# Patient Record
Sex: Female | Born: 1970 | Race: Black or African American | Hispanic: No | Marital: Single | Smoking: Never smoker
Health system: Southern US, Community
[De-identification: ages and names within clinical notes are randomized; demographics above are authoritative.]

## PROBLEM LIST (undated history)

## (undated) DIAGNOSIS — J45909 Unspecified asthma, uncomplicated: Secondary | ICD-10-CM

---

## 1998-02-05 ENCOUNTER — Other Ambulatory Visit: Admission: RE | Admit: 1998-02-05 | Discharge: 1998-02-05 | Payer: Self-pay | Admitting: Obstetrics and Gynecology

## 1998-08-15 ENCOUNTER — Emergency Department (HOSPITAL_COMMUNITY): Admission: EM | Admit: 1998-08-15 | Discharge: 1998-08-15 | Payer: Self-pay | Admitting: Emergency Medicine

## 1999-02-04 ENCOUNTER — Encounter: Payer: Self-pay | Admitting: Emergency Medicine

## 1999-02-04 ENCOUNTER — Emergency Department (HOSPITAL_COMMUNITY): Admission: EM | Admit: 1999-02-04 | Discharge: 1999-02-04 | Payer: Self-pay | Admitting: Emergency Medicine

## 1999-03-31 ENCOUNTER — Emergency Department (HOSPITAL_COMMUNITY): Admission: EM | Admit: 1999-03-31 | Discharge: 1999-03-31 | Payer: Self-pay | Admitting: Emergency Medicine

## 1999-04-22 ENCOUNTER — Other Ambulatory Visit: Admission: RE | Admit: 1999-04-22 | Discharge: 1999-04-22 | Payer: Self-pay | Admitting: Obstetrics and Gynecology

## 2000-01-01 ENCOUNTER — Encounter: Payer: Self-pay | Admitting: Otolaryngology

## 2000-01-01 ENCOUNTER — Encounter: Admission: RE | Admit: 2000-01-01 | Discharge: 2000-01-01 | Payer: Self-pay | Admitting: Otolaryngology

## 2002-10-30 ENCOUNTER — Emergency Department (HOSPITAL_COMMUNITY): Admission: EM | Admit: 2002-10-30 | Discharge: 2002-10-30 | Payer: Self-pay | Admitting: Emergency Medicine

## 2003-04-01 ENCOUNTER — Emergency Department (HOSPITAL_COMMUNITY): Admission: EM | Admit: 2003-04-01 | Discharge: 2003-04-01 | Payer: Self-pay | Admitting: Emergency Medicine

## 2005-10-26 ENCOUNTER — Emergency Department (HOSPITAL_COMMUNITY): Admission: EM | Admit: 2005-10-26 | Discharge: 2005-10-27 | Payer: Self-pay | Admitting: Emergency Medicine

## 2019-03-16 ENCOUNTER — Emergency Department: Payer: Self-pay

## 2019-03-16 ENCOUNTER — Emergency Department
Admission: EM | Admit: 2019-03-16 | Discharge: 2019-03-16 | Disposition: A | Discharge status: Home / Self Care | Payer: Self-pay | Attending: Emergency Medicine | Admitting: Emergency Medicine

## 2019-03-16 ENCOUNTER — Encounter: Payer: Self-pay | Admitting: *Deleted

## 2019-03-16 ENCOUNTER — Other Ambulatory Visit: Payer: Self-pay

## 2019-03-16 DIAGNOSIS — M79662 Pain in left lower leg: Secondary | ICD-10-CM | POA: Insufficient documentation

## 2019-03-16 DIAGNOSIS — W19XXXA Unspecified fall, initial encounter: Secondary | ICD-10-CM | POA: Insufficient documentation

## 2019-03-16 DIAGNOSIS — J45909 Unspecified asthma, uncomplicated: Secondary | ICD-10-CM | POA: Insufficient documentation

## 2019-03-16 DIAGNOSIS — M79605 Pain in left leg: Secondary | ICD-10-CM

## 2019-03-16 HISTORY — DX: Unspecified asthma, uncomplicated: J45.909

## 2019-03-16 MED ORDER — NAPROXEN 500 MG PO TABS
500.0000 mg | ORAL_TABLET | Freq: Once | ORAL | Status: AC
  Administered 2019-03-16: 21:00:00 500 mg via ORAL
  Filled 2019-03-16: qty 1

## 2019-03-16 MED ORDER — NAPROXEN 500 MG PO TABS: 500.0000 mg | ORAL_TABLET | Freq: Two times a day (BID) | ORAL | 0 refills | Status: AC

## 2019-03-16 NOTE — Discharge Instructions (Signed)
Please follow up with your primary care provider for symptoms that are not improving over the next few days.  Rest, ice, and elevate your leg often for the next few days.

## 2019-03-16 NOTE — ED Triage Notes (Signed)
Pt presents via EMS after injury sustained at [facility]. Pt reports a mirror falling on L leg. Pt has not ambulated since injury. Pt has minor abrasions/lacerations. Pedal pulses 2+ and strong. Bleeding controlled at this time.

## 2019-03-16 NOTE — ED Provider Notes (Signed)
[facility] Emergency Department Provider Note ____________________________________________  Time seen: Approximately 10:56 PM  I have reviewed the triage vital signs and the nursing notes.   HISTORY  Chief Complaint Extremity Laceration    HPI Jenny Mclaughlin is a 48 y.o. female who presents to the emergency department via EMS for evaluation and treatment of left lower extremity pain after a mirror fell over while shopping at [employer] lobby.  She states that it was a long heavy mirror.  No alleviating measures attempted prior to arrival  Past Medical History:  Diagnosis Date  . Asthma     There are no active problems to display for this patient.   History reviewed. No pertinent surgical history.  Prior to Admission medications   Medication Sig Start Date End Date Taking? Authorizing Provider  albuterol (VENTOLIN HFA) 108 (90 Base) MCG/ACT inhaler Inhale into the lungs every 6 (six) hours as needed for wheezing or shortness of breath.   Yes [provider]  naproxen (NAPROSYN) 500 MG tablet Take 1 tablet (500 mg total) by mouth 2 (two) times daily with a meal. 03/16/19   Davonte Siebenaler B, FNP    Allergies Septra [sulfamethoxazole-trimethoprim]  History reviewed. No pertinent family history.  Social History Social History   Tobacco Use  . Smoking status: Never Smoker  . Smokeless tobacco: Never Used  Substance Use Topics  . Alcohol use: Never    Frequency: Never  . Drug use: Never    Review of Systems Constitutional: Negative for fever. Cardiovascular: Negative for chest pain. Respiratory: Negative for shortness of breath. Musculoskeletal: Positive for lower extremity pain.  Skin: Positive for small abrasion to LLE.  Neurological: Negative for decrease in sensation  ____________________________________________   PHYSICAL EXAM:  VITAL SIGNS: ED Triage Vitals  Enc Vitals Group     BP 03/16/19 2048 123/73     Pulse Rate 03/16/19  2048 84     Resp 03/16/19 2048 18     Temp 03/16/19 2048 98.2 F (36.8 C)     Temp Source 03/16/19 2048 Oral     SpO2 03/16/19 2045 100 %     Weight --      Height --      Head Circumference --      Peak Flow --      Pain Score 03/16/19 2049 7     Pain Loc --      Pain Edu? --      Excl. in GC? --     Constitutional: Alert and oriented. Well appearing and in no acute distress. Eyes: Conjunctivae are clear without discharge or drainage Head: Atraumatic Neck: Supple. Respiratory: No cough. Respirations are even and unlabored. Musculoskeletal: Area of reported pain is without any visual abnormality.  Pain is reported with very light touch over any pretibial area. Neurologic: Motor and sensory function is intact throughout. Skin: Very small superficial abrasion without any active bleeding is noted to the left lower extremity just above the medial malleolus Psychiatric: Affect and behavior are appropriate.  ____________________________________________   LABS (all labs ordered are listed, but only abnormal results are displayed)  Labs Reviewed - No data to display ____________________________________________  RADIOLOGY  Image of the tibia and fibula of the left lower extremity is negative for acute findings. ____________________________________________   PROCEDURES  Procedures  ____________________________________________   INITIAL IMPRESSION / ASSESSMENT AND PLAN / ED COURSE  Jenny Mclaughlin is a 48 y.o. who presents to the emergency department for treatment and evaluation after  a mirror reportedly fell over onto her leg while in the [employer].  Imaging and exam are reassuring.  She will be advised to take Naprosyn twice a day if needed.  She was advised to ice and elevate as well.  She is to follow-up with primary care for symptoms of concern.   Medications  naproxen (NAPROSYN) tablet 500 mg (500 mg Oral Given 03/16/19 2128)    Pertinent labs & imaging results that  were available during my care of the patient were reviewed by me and considered in my medical decision making (see chart for details).  _________________________________________   FINAL CLINICAL IMPRESSION(S) / ED DIAGNOSES  Final diagnoses:  Leg pain, anterior, left    ED Discharge Orders         Ordered    naproxen (NAPROSYN) 500 MG tablet  2 times daily with meals     03/16/19 2156           If controlled substance prescribed during this visit, 12 month history viewed on the [place] prior to issuing an initial prescription for Schedule II or III opiod.   Victorino Dike, FNP 03/16/19 2303    Schuyler Amor, MD 03/16/19 [phone]

## 2019-04-09 ENCOUNTER — Other Ambulatory Visit: Payer: Self-pay | Admitting: Internal Medicine

## 2019-04-09 DIAGNOSIS — Z20822 Contact with and (suspected) exposure to covid-19: Secondary | ICD-10-CM

## 2019-04-11 LAB — SPECIMEN STATUS REPORT

## 2019-04-11 LAB — NOVEL CORONAVIRUS, NAA: SARS-CoV-2, NAA: NOT DETECTED

## 2019-09-27 IMAGING — DX LEFT TIBIA AND FIBULA - 2 VIEW
3 series · 3 of 3 positions shown · non-contrast
Comparison: None.

CLINICAL DATA: Mirror fell on leg, laceration

EXAM:
LEFT TIBIA AND FIBULA - 2 VIEW

[tibia ap]
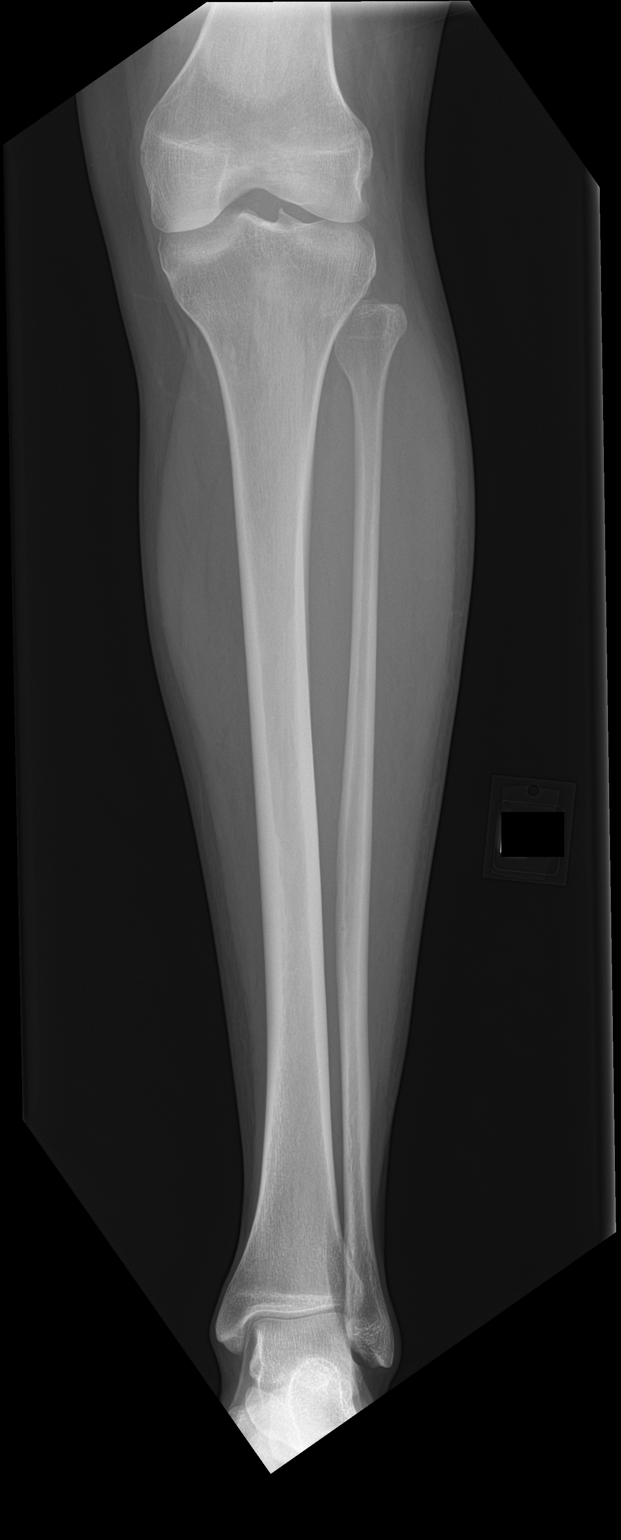

[tibia lat (1 of 2)]
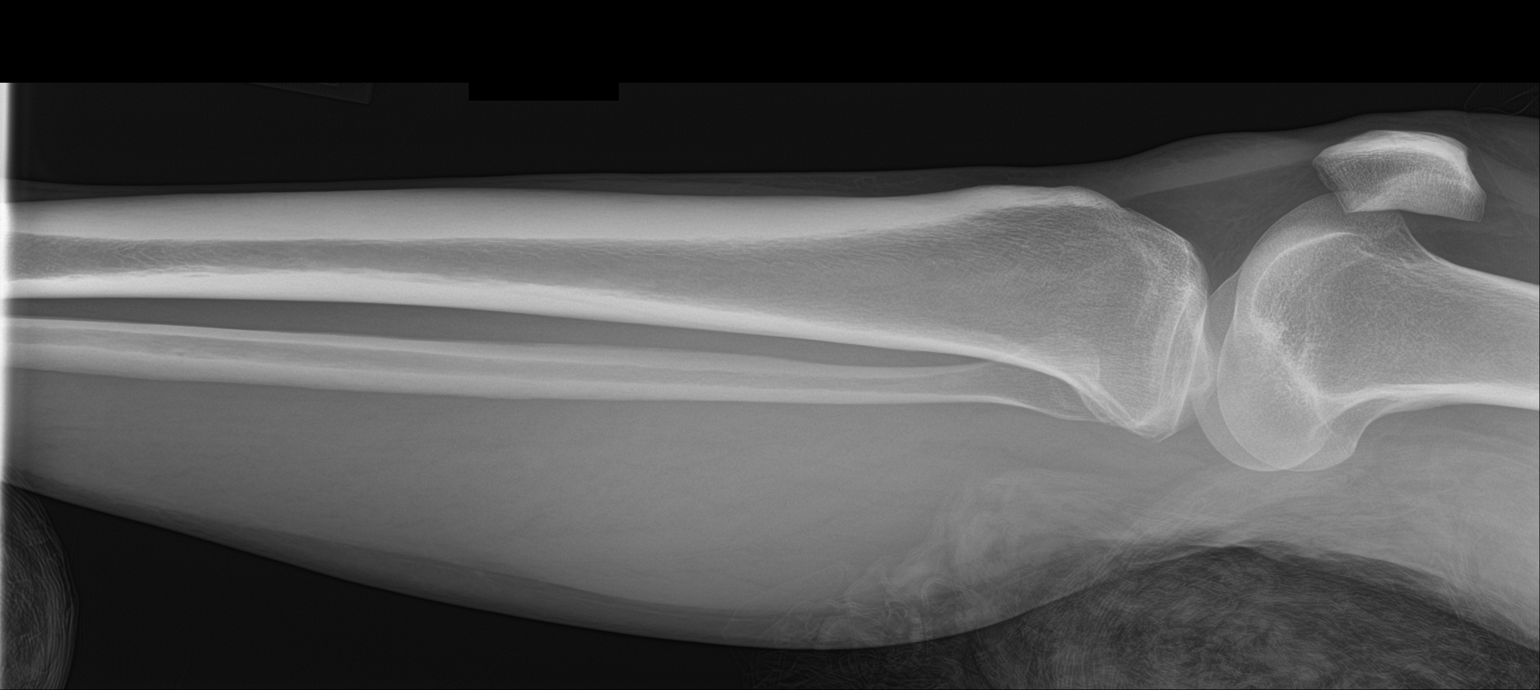

[tibia lat (2 of 2)]
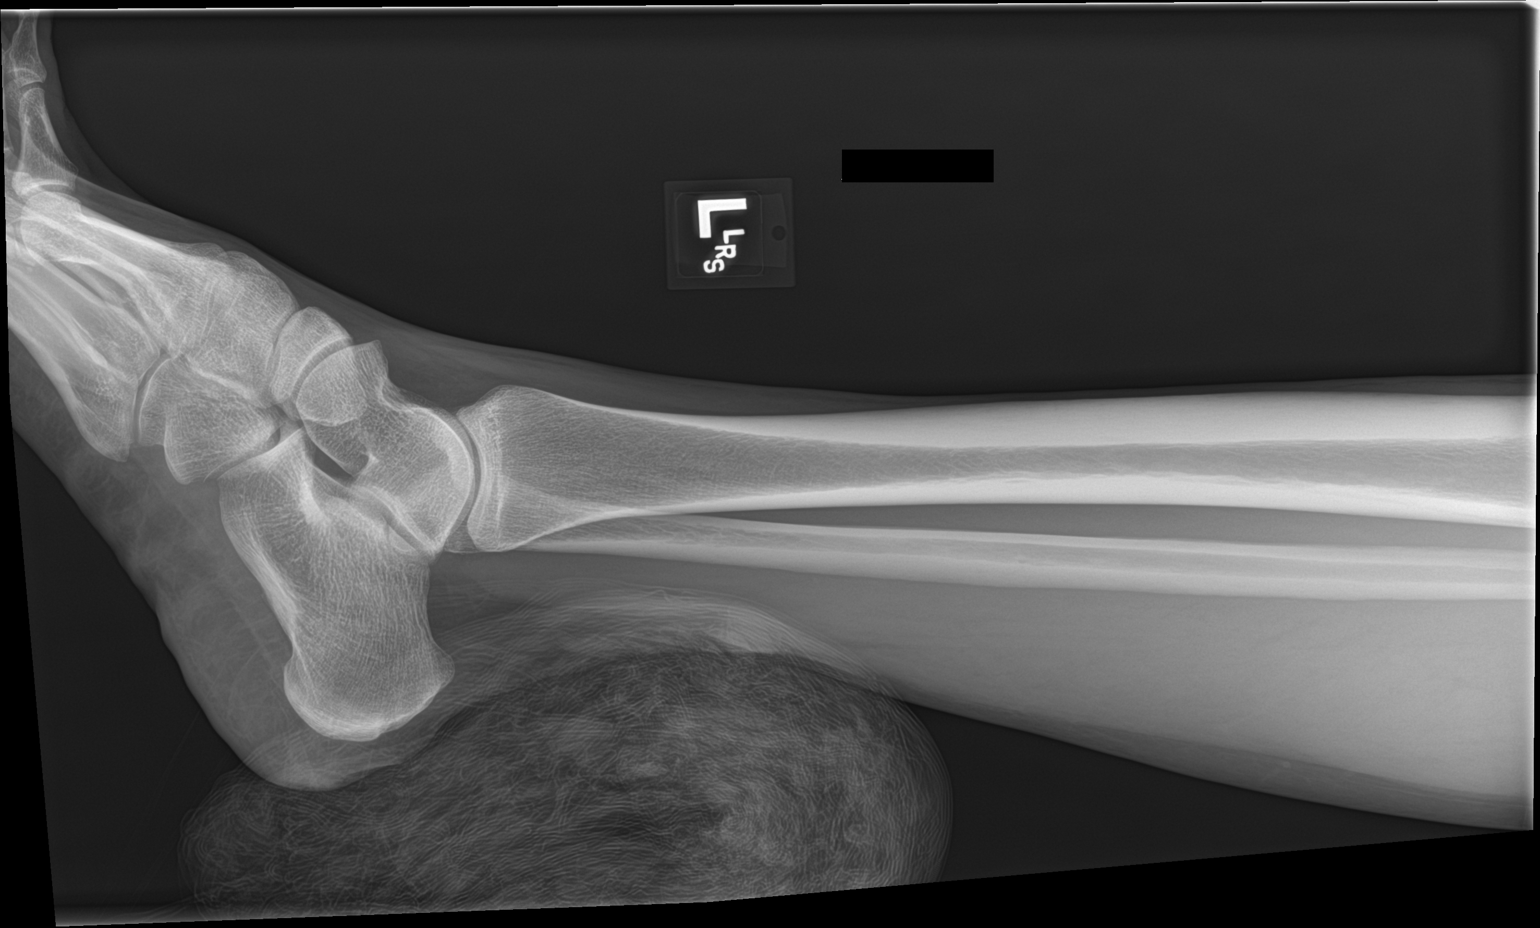

[3 of 3 positions shown; findings below may reference images not displayed]

FINDINGS: There is no evidence of fracture or other focal bone lesions. Soft
tissues are unremarkable.
IMPRESSION: Negative.
